# Patient Record
Sex: Male | Born: 1940 | Hispanic: No | Marital: Married | Smoking: Former smoker
Health system: Southern US, Community
[De-identification: ages and names within clinical notes are randomized; demographics above are authoritative.]

---

## 2010-04-08 ENCOUNTER — Ambulatory Visit (HOSPITAL_BASED_OUTPATIENT_CLINIC_OR_DEPARTMENT_OTHER): Admission: RE | Admit: 2010-04-08 | Discharge: 2010-04-08 | Payer: Self-pay | Admitting: Orthopedic Surgery

## 2010-04-08 ENCOUNTER — Ambulatory Visit: Payer: Self-pay | Admitting: Radiology

## 2015-10-12 DIAGNOSIS — I10 Essential (primary) hypertension: Secondary | ICD-10-CM | POA: Insufficient documentation

## 2015-10-13 DIAGNOSIS — J9601 Acute respiratory failure with hypoxia: Secondary | ICD-10-CM | POA: Insufficient documentation

## 2015-10-14 DIAGNOSIS — G250 Essential tremor: Secondary | ICD-10-CM | POA: Insufficient documentation

## 2016-01-19 DIAGNOSIS — G8929 Other chronic pain: Secondary | ICD-10-CM | POA: Insufficient documentation

## 2016-01-19 DIAGNOSIS — I2510 Atherosclerotic heart disease of native coronary artery without angina pectoris: Secondary | ICD-10-CM | POA: Insufficient documentation

## 2016-01-19 DIAGNOSIS — I214 Non-ST elevation (NSTEMI) myocardial infarction: Secondary | ICD-10-CM | POA: Insufficient documentation

## 2016-01-19 DIAGNOSIS — N183 Chronic kidney disease, stage 3 unspecified: Secondary | ICD-10-CM | POA: Insufficient documentation

## 2016-01-19 DIAGNOSIS — E782 Mixed hyperlipidemia: Secondary | ICD-10-CM | POA: Insufficient documentation

## 2016-01-19 DIAGNOSIS — E039 Hypothyroidism, unspecified: Secondary | ICD-10-CM | POA: Insufficient documentation

## 2016-01-19 DIAGNOSIS — R0989 Other specified symptoms and signs involving the circulatory and respiratory systems: Secondary | ICD-10-CM | POA: Insufficient documentation

## 2016-01-19 DIAGNOSIS — M545 Low back pain, unspecified: Secondary | ICD-10-CM | POA: Insufficient documentation

## 2016-01-23 DIAGNOSIS — R609 Edema, unspecified: Secondary | ICD-10-CM | POA: Insufficient documentation

## 2016-01-23 DIAGNOSIS — J431 Panlobular emphysema: Secondary | ICD-10-CM | POA: Insufficient documentation

## 2016-01-23 DIAGNOSIS — Z951 Presence of aortocoronary bypass graft: Secondary | ICD-10-CM | POA: Insufficient documentation

## 2016-02-21 DIAGNOSIS — R609 Edema, unspecified: Secondary | ICD-10-CM | POA: Insufficient documentation

## 2016-02-21 DIAGNOSIS — R600 Localized edema: Secondary | ICD-10-CM | POA: Insufficient documentation

## 2016-02-26 DIAGNOSIS — I4819 Other persistent atrial fibrillation: Secondary | ICD-10-CM | POA: Insufficient documentation

## 2016-04-03 DIAGNOSIS — IMO0002 Reserved for concepts with insufficient information to code with codable children: Secondary | ICD-10-CM | POA: Insufficient documentation

## 2016-04-03 DIAGNOSIS — I255 Ischemic cardiomyopathy: Secondary | ICD-10-CM | POA: Insufficient documentation

## 2016-04-03 DIAGNOSIS — Z87891 Personal history of nicotine dependence: Secondary | ICD-10-CM | POA: Insufficient documentation

## 2017-08-14 DIAGNOSIS — Z7982 Long term (current) use of aspirin: Secondary | ICD-10-CM | POA: Insufficient documentation

## 2017-09-17 DIAGNOSIS — I6523 Occlusion and stenosis of bilateral carotid arteries: Secondary | ICD-10-CM | POA: Insufficient documentation

## 2017-10-15 DIAGNOSIS — Z9981 Dependence on supplemental oxygen: Secondary | ICD-10-CM | POA: Insufficient documentation

## 2017-10-15 DIAGNOSIS — R0602 Shortness of breath: Secondary | ICD-10-CM | POA: Insufficient documentation

## 2017-10-15 DIAGNOSIS — F419 Anxiety disorder, unspecified: Secondary | ICD-10-CM | POA: Insufficient documentation

## 2018-01-08 DIAGNOSIS — I340 Nonrheumatic mitral (valve) insufficiency: Secondary | ICD-10-CM | POA: Insufficient documentation

## 2018-01-09 DIAGNOSIS — Z7901 Long term (current) use of anticoagulants: Secondary | ICD-10-CM | POA: Insufficient documentation

## 2018-01-17 DIAGNOSIS — I429 Cardiomyopathy, unspecified: Secondary | ICD-10-CM | POA: Insufficient documentation

## 2018-02-04 DIAGNOSIS — R339 Retention of urine, unspecified: Secondary | ICD-10-CM | POA: Insufficient documentation

## 2018-06-16 ENCOUNTER — Ambulatory Visit (HOSPITAL_BASED_OUTPATIENT_CLINIC_OR_DEPARTMENT_OTHER)
Admission: RE | Admit: 2018-06-16 | Discharge: 2018-06-16 | Disposition: A | Discharge status: Home / Self Care | Payer: Medicare Other | Source: Ambulatory Visit | Attending: Pulmonary Disease | Admitting: Pulmonary Disease

## 2018-06-16 ENCOUNTER — Ambulatory Visit: Payer: Medicare Other | Admitting: Pulmonary Disease

## 2018-06-16 DIAGNOSIS — J189 Pneumonia, unspecified organism: Secondary | ICD-10-CM

## 2018-06-16 DIAGNOSIS — J90 Pleural effusion, not elsewhere classified: Secondary | ICD-10-CM | POA: Insufficient documentation

## 2018-06-16 DIAGNOSIS — J181 Lobar pneumonia, unspecified organism: Secondary | ICD-10-CM

## 2018-06-16 DIAGNOSIS — R0602 Shortness of breath: Secondary | ICD-10-CM

## 2018-06-16 DIAGNOSIS — Z951 Presence of aortocoronary bypass graft: Secondary | ICD-10-CM | POA: Insufficient documentation

## 2018-06-16 DIAGNOSIS — J432 Centrilobular emphysema: Secondary | ICD-10-CM

## 2018-06-16 NOTE — Progress Notes (Signed)
Synopsis: Referred in July 2019 for COPD.  He has a history of atrial fibrillation, chronic respiratory failure with hypoxemia and has been on 2 L of oxygen since 2016.  Subjective:   PATIENT ID: James Santana GENDER: male DOB: 1941-08-05, MRN: [id number]   HPI  No chief complaint on file.  This is a pleasant 77 year old male with a past medical history significant for COPD who comes to my clinic today for evaluation and management of the same.  His past medical history includes coronary artery disease, he is status post CABG surgery, and atrial fibrillation.  His wife provides much of the history because the patient seems to be a bit confused.  She says that his breathing has been stable recently.  He does produce yellow mucus on a daily basis which has been a chronic symptom for him for at least the last 2 to 3 years.  He takes Symbicort in the mornings and in the evenings any take Spiriva daily.  He uses albuterol a few times a week as needed for chest tightness or shortness of breath.  They tell me that in February of this year he had fluid retention and ankle swelling and was hospitalized for management of the same.  While there he ended up having pneumonia which was treated.  By my review of the medical record it seems as if he had concern for an empyema and a CT scan was performed in March showing a loculated pleural effusion on the right lung.  He was also noted to have emphysema.  He has been using 2 L of oxygen continuously for the last 3 years.  He is participating in exercise activities at home per the direction of a physical therapist.  He says that he can walk on level ground without stopping but his wife says there is no way he can make it through a grocery store.  No past medical history on file.   No family history on file.   Social History   Socioeconomic History  . Marital status: Married    Spouse name: Not on file  . Number of children: Not on file  . Years  of education: Not on file  . Highest education level: Not on file  Occupational History  . Not on file  Social Needs  . Financial resource strain: Not on file  . Food insecurity:    Worry: Not on file    Inability: Not on file  . Transportation needs:    Medical: Not on file    Non-medical: Not on file  Tobacco Use  . Smoking status: Not on file  Substance and Sexual Activity  . Alcohol use: Not on file  . Drug use: Not on file  . Sexual activity: Not on file  Lifestyle  . Physical activity:    Days per week: Not on file    Minutes per session: Not on file  . Stress: Not on file  Relationships  . Social connections:    Talks on phone: Not on file    Gets together: Not on file    Attends religious service: Not on file    Active member of club or organization: Not on file    Attends meetings of clubs or organizations: Not on file    Relationship status: Not on file  . Intimate partner violence:    Fear of current or ex partner: Not on file    Emotionally abused: Not on file    Physically abused:  Not on file    Forced sexual activity: Not on file  Other Topics Concern  . Not on file  Social History Narrative  . Not on file     Not on File   Outpatient Medications Prior to Visit  Medication Sig Dispense Refill  . albuterol (PROVENTIL HFA;VENTOLIN HFA) 108 (90 Base) MCG/ACT inhaler Inhale into the lungs every 6 (six) hours as needed for wheezing or shortness of breath.    Marland Kitchen apixaban (ELIQUIS) 2.5 MG TABS tablet Take 2.5 mg by mouth 2 (two) times daily.    Marland Kitchen aspirin EC 81 MG tablet Take 81 mg by mouth daily.    Marland Kitchen atorvastatin (LIPITOR) 40 MG tablet Take 40 mg by mouth daily.    . budesonide-formoterol (SYMBICORT) 160-4.5 MCG/ACT inhaler Inhale 2 puffs into the lungs 2 (two) times daily.    . busPIRone (BUSPAR) 10 MG tablet Take 10 mg by mouth 3 (three) times daily.    . citalopram (CELEXA) 20 MG tablet Take 20 mg by mouth daily.    . clobetasol cream (TEMOVATE) 0.05 %  Apply 1 application topically 2 (two) times daily.    . fluticasone (FLONASE) 50 MCG/ACT nasal spray Place into both nostrils daily.    Marland Kitchen levothyroxine (SYNTHROID, LEVOTHROID) 50 MCG tablet Take 50 mcg by mouth daily before breakfast.    . lisinopril (PRINIVIL,ZESTRIL) 2.5 MG tablet Take 2.5 mg by mouth daily.    . metFORMIN (GLUCOPHAGE) 500 MG tablet Take by mouth 2 (two) times daily with a meal.    . metolazone (ZAROXOLYN) 2.5 MG tablet Take 2.5 mg by mouth daily.    . nitroGLYCERIN (NITROSTAT) 0.4 MG SL tablet Place 0.4 mg under the tongue every 5 (five) minutes as needed for chest pain.    Marland Kitchen omeprazole (PRILOSEC) 20 MG capsule Take 20 mg by mouth daily.    . tamsulosin (FLOMAX) 0.4 MG CAPS capsule Take 0.4 mg by mouth.    . tiotropium (SPIRIVA) 18 MCG inhalation capsule Place 18 mcg into inhaler and inhale daily.    Marland Kitchen torsemide (DEMADEX) 20 MG tablet Take 20 mg by mouth daily.     No facility-administered medications prior to visit.     Review of Systems  Constitutional: Positive for malaise/fatigue. Negative for chills, fever and weight loss.  HENT: Negative for congestion, nosebleeds, sinus pain and sore throat.   Eyes: Negative for photophobia, pain and discharge.  Respiratory: Positive for cough and shortness of breath. Negative for hemoptysis, sputum production and wheezing.   Cardiovascular: Negative for chest pain, palpitations and orthopnea.  Gastrointestinal: Negative for abdominal pain, constipation, diarrhea, nausea and vomiting.  Genitourinary: Negative for dysuria, frequency, hematuria and urgency.  Musculoskeletal: Negative for back pain, joint pain, myalgias and neck pain.  Skin: Negative for itching and rash.  Neurological: Negative for tingling, tremors, sensory change, speech change, focal weakness, seizures, weakness and headaches.  Psychiatric/Behavioral: Negative for memory loss, substance abuse and suicidal ideas. The patient is not nervous/anxious.        Objective:  Physical Exam   There were no vitals filed for this visit.  RA  Walked 500 feet on 2 L nasal cannula and O2 saturation remained at 98% or above  Gen: chronically ill appearing, no acute distress HENT: NCAT, OP clear, neck supple without masses Eyes: PERRL, EOMi Lymph: no cervical lymphadenopathy PULM: Coarse crackles bases bilaterally, normal air movement and effort CV: Irreg irreg, no mgr, no JVD GI: BS+, soft, nontender, no hsm Derm: no rash  or skin breakdown MSK: normal bulk and tone Neuro: A&Ox4, CN II-XII intact, strength 5/5 in all 4 extremities Psyche: normal mood and affect   CBC No results found for: WBC, RBC, HGB, HCT, PLT, MCV, MCH, MCHC, RDW, LYMPHSABS, MONOABS, EOSABS, BASOSABS   Chest imaging: March 2019 chest CT from an outside facility showed a loculated pleural effusion on the right, some airspace disease on the right, emphysema.  Small to borderline enlarged mediastinal lymph nodes noted.  PFT: July 2018 spirometry from outside facility showed an FEV1 of 1.33 L 58% predicted July 2019 ratio 51%, FEV1 1.47 L 52% predicted, forced vital capacity 2.9 L 77% predicted  Labs:  Path:  Echo:  Heart Catheterization:  Records from his primary care physician reviewed where it seems that he had a known problem of a pleural effusion on the right with concern for a lung mass.  CT scanning of the chest showed residual pneumonia.     Assessment & Plan:   Shortness of breath - Plan: DG Chest 2 View, Spirometry with graph, Spirometry with graph  Centrilobular emphysema (HCC)  Community acquired pneumonia of right middle lobe of lung (HCC)  Pleural effusion on right  Discussion: This is a pleasant 77 year old male with a past medical history significant for at least moderate COPD who comes to our clinic today for evaluation and management of the same.  He also has chronic respiratory failure with hypoxemia which is presumably due to his  COPD.  However, he did have a recent case of pneumonia which apparently was quite severe and left him with a pleural effusion on the right side.  We need to get a chest x-ray today to see if this is cleared up.  At this point it is too late to consider any sort of surgical intervention if there is a residual pleural effusion.  His current medical regimen for his COPD is adequate.  I see no reason to make changes right now.  Of note he takes amiodarone and lisinopril and seems to have no ill effect from a respiratory standpoint from either.  Plan: COPD: Continue Symbicort Continue Spiriva Keep using albuterol as needed for chest tightness wheezing or shortness of breath Stay active, try to exercise regularly If you are interested in pulmonary rehab please let us know Practice good hand hygiene Get a flu shot in the fall Spirometry test today  Recent pneumonia with pleural effusion: We will check a chest x-ray today and call you with the result  Chronic respiratory failure with hypoxemia: Continue 2 L of oxygen continuously  We will see you back in 3 months or sooner if needed    Current Outpatient Medications:  .  albuterol (PROVENTIL HFA;VENTOLIN HFA) 108 (90 Base) MCG/ACT inhaler, Inhale into the lungs every 6 (six) hours as needed for wheezing or shortness of breath., Disp: , Rfl:  .  apixaban (ELIQUIS) 2.5 MG TABS tablet, Take 2.5 mg by mouth 2 (two) times daily., Disp: , Rfl:  .  aspirin EC 81 MG tablet, Take 81 mg by mouth daily., Disp: , Rfl:  .  atorvastatin (LIPITOR) 40 MG tablet, Take 40 mg by mouth daily., Disp: , Rfl:  .  budesonide-formoterol (SYMBICORT) 160-4.5 MCG/ACT inhaler, Inhale 2 puffs into the lungs 2 (two) times daily., Disp: , Rfl:  .  busPIRone (BUSPAR) 10 MG tablet, Take 10 mg by mouth 3 (three) times daily., Disp: , Rfl:  .  citalopram (CELEXA) 20 MG tablet, Take 20 mg by mouth daily.,  Disp: , Rfl:  .  clobetasol cream (TEMOVATE) 0.05 %, Apply 1  application topically 2 (two) times daily., Disp: , Rfl:  .  fluticasone (FLONASE) 50 MCG/ACT nasal spray, Place into both nostrils daily., Disp: , Rfl:  .  levothyroxine (SYNTHROID, LEVOTHROID) 50 MCG tablet, Take 50 mcg by mouth daily before breakfast., Disp: , Rfl:  .  lisinopril (PRINIVIL,ZESTRIL) 2.5 MG tablet, Take 2.5 mg by mouth daily., Disp: , Rfl:  .  metFORMIN (GLUCOPHAGE) 500 MG tablet, Take by mouth 2 (two) times daily with a meal., Disp: , Rfl:  .  metolazone (ZAROXOLYN) 2.5 MG tablet, Take 2.5 mg by mouth daily., Disp: , Rfl:  .  nitroGLYCERIN (NITROSTAT) 0.4 MG SL tablet, Place 0.4 mg under the tongue every 5 (five) minutes as needed for chest pain., Disp: , Rfl:  .  omeprazole (PRILOSEC) 20 MG capsule, Take 20 mg by mouth daily., Disp: , Rfl:  .  tamsulosin (FLOMAX) 0.4 MG CAPS capsule, Take 0.4 mg by mouth., Disp: , Rfl:  .  tiotropium (SPIRIVA) 18 MCG inhalation capsule, Place 18 mcg into inhaler and inhale daily., Disp: , Rfl:  .  torsemide (DEMADEX) 20 MG tablet, Take 20 mg by mouth daily., Disp: , Rfl:

## 2018-06-16 NOTE — Patient Instructions (Signed)
COPD: Continue Symbicort Continue Spiriva Keep using albuterol as needed for chest tightness wheezing or shortness of breath Stay active, try to exercise regularly If you are interested in pulmonary rehab please let us know Practice good hand hygiene Get a flu shot in the fall Spirometry test today  Recent pneumonia with pleural effusion: We will check a chest x-ray today and call you with the result  Chronic respiratory failure with hypoxemia: Continue 2 L of oxygen continuously  We will see you back in 3 months or sooner if needed

## 2018-06-17 ENCOUNTER — Telehealth: Payer: Self-pay | Admitting: Pulmonary Disease

## 2018-06-17 NOTE — Telephone Encounter (Signed)
Notes recorded by Lupita LeashMcQuaid, Douglas B, MD on 06/17/2018 at 1:55 PM EDT BJ, Please let the patient know this showed that there was still some fluid in his R lung and radiology recommends we repeat a CXR. Can we order this for the next visit? Reason pleural effusion. Thanks, B ------------------------------ Spoke with pt. He is aware of results. Nothing further was needed.

## 2018-06-18 ENCOUNTER — Other Ambulatory Visit: Payer: Self-pay | Admitting: Pulmonary Disease

## 2018-06-18 DIAGNOSIS — J90 Pleural effusion, not elsewhere classified: Secondary | ICD-10-CM

## 2018-09-15 NOTE — Progress Notes (Deleted)
@Patient  ID: James Santana, male    DOB: Feb 19, 1941, 77 y.o.   MRN: [id number]  No chief complaint on file.   Referring provider: Shellia Cleverly, PA  HPI:  77 year old male followed in our office for COPD  PMH: CAD, status post CABG surgery, A. Fib (on eliquis) Smoker/ Smoking History:  Maintenance:  Symbicort 160, Spiriva Pt of: Dr. Kendrick Fries  Recent [facility] Encounters:   06/16/18-initial office visit-Mcquaid Patient presenting today to establish care for management of COPD.  Patient takes Symbicort in the mornings and evenings and takes Spiriva daily.  Patient produces yellow mucus on a daily basis this is been a chronic symptom for him for the last 2 to 3 years.  Wife presenting with patient today is providing much of the patient history as patient is intermittently confused.  Patient's wife reports that the patient was treated in February/2019 for pneumonia.  CT scan was performed in March/2019 due to concern for empyema.  Showing loculated pleural effusion.  Patient's been using 2 L of oxygen continuously for the last 3 years.  He participates in exercise activities at home per the direction of physical therapy. Plan: Chest x-ray, Continue Symbicort, continue Spiriva, stay active, get flu shot in fall, spirometry today, continue 2 L of oxygen, follow-up in 3 months   09/15/2018  - Visit   HPI    Tests:   06/17/18-chest x-ray- right perihilar right base infiltrate right-sided pleural effusion again noted, recommend follow-up chest x-ray in 3 to 4 weeks 06/16/2018-spirometry- FVC 2.9 (77% predicted), ratio 51%, FEV1 2.50 (52% predicted)  Chest imaging: March 2019 chest CT from an outside facility showed a loculated pleural effusion on the right, some airspace disease on the right, emphysema.  Small to borderline enlarged mediastinal lymph nodes noted.  PFT: July 2018 spirometry from outside facility showed an FEV1 of 1.33 L 58% predicted July 2019 ratio  51%, FEV1 1.47 L 52% predicted, forced vital capacity 2.9 L 77% predicted  Records from his primary care physician reviewed where it seems that he had a known problem of a pleural effusion on the right with concern for a lung mass.  CT scanning of the chest showed residual pneumonia.   FENO:  No results found for: NITRICOXIDE  PFT: No flowsheet data found.  Imaging: No results found.  Chart Review:    Specialty Problems    None      Not on File   There is no immunization history on file for this patient.  No past medical history on file.  Tobacco History: Social History   Tobacco Use  Smoking Status Not on file   Counseling given: Not Answered   Outpatient Encounter Medications as of 09/16/2018  Medication Sig  . albuterol (PROVENTIL HFA;VENTOLIN HFA) 108 (90 Base) MCG/ACT inhaler Inhale into the lungs every 6 (six) hours as needed for wheezing or shortness of breath.  Marland Kitchen apixaban (ELIQUIS) 2.5 MG TABS tablet Take 2.5 mg by mouth 2 (two) times daily.  Marland Kitchen aspirin EC 81 MG tablet Take 81 mg by mouth daily.  Marland Kitchen atorvastatin (LIPITOR) 40 MG tablet Take 40 mg by mouth daily.  . budesonide-formoterol (SYMBICORT) 160-4.5 MCG/ACT inhaler Inhale 2 puffs into the lungs 2 (two) times daily.  . busPIRone (BUSPAR) 10 MG tablet Take 10 mg by mouth 3 (three) times daily.  . citalopram (CELEXA) 20 MG tablet Take 20 mg by mouth daily.  . clobetasol cream (TEMOVATE) 0.05 % Apply 1 application topically 2 (two) times  daily.  . fluticasone (FLONASE) 50 MCG/ACT nasal spray Place into both nostrils daily.  Marland Kitchen levothyroxine (SYNTHROID, LEVOTHROID) 50 MCG tablet Take 50 mcg by mouth daily before breakfast.  . lisinopril (PRINIVIL,ZESTRIL) 2.5 MG tablet Take 2.5 mg by mouth daily.  . metFORMIN (GLUCOPHAGE) 500 MG tablet Take by mouth 2 (two) times daily with a meal.  . metolazone (ZAROXOLYN) 2.5 MG tablet Take 2.5 mg by mouth daily.  . nitroGLYCERIN (NITROSTAT) 0.4 MG SL tablet Place 0.4 mg  under the tongue every 5 (five) minutes as needed for chest pain.  Marland Kitchen omeprazole (PRILOSEC) 20 MG capsule Take 20 mg by mouth daily.  . tamsulosin (FLOMAX) 0.4 MG CAPS capsule Take 0.4 mg by mouth.  . tiotropium (SPIRIVA) 18 MCG inhalation capsule Place 18 mcg into inhaler and inhale daily.  Marland Kitchen torsemide (DEMADEX) 20 MG tablet Take 20 mg by mouth daily.   No facility-administered encounter medications on file as of 09/16/2018.      Review of Systems  Review of Systems   Physical Exam  There were no vitals taken for this visit.  Wt Readings from Last 5 Encounters:  No data found for Wt     Physical Exam    Lab Results:  CBC No results found for: WBC, RBC, HGB, HCT, PLT, MCV, MCH, MCHC, RDW, LYMPHSABS, MONOABS, EOSABS, BASOSABS  BMET No results found for: NA, K, CL, CO2, GLUCOSE, BUN, CREATININE, CALCIUM, GFRNONAA, GFRAA  BNP No results found for: BNP  ProBNP No results found for: PROBNP    Assessment & Plan:     No problem-specific Assessment & Plan notes found for this encounter.     Coral Ceo, NP 09/15/2018

## 2018-09-16 ENCOUNTER — Ambulatory Visit: Payer: Medicare Other | Admitting: Pulmonary Disease

## 2018-09-18 ENCOUNTER — Other Ambulatory Visit: Payer: Self-pay

## 2018-09-18 ENCOUNTER — Ambulatory Visit (HOSPITAL_BASED_OUTPATIENT_CLINIC_OR_DEPARTMENT_OTHER)
Admission: RE | Admit: 2018-09-18 | Discharge: 2018-09-18 | Disposition: A | Discharge status: Home / Self Care | Payer: Medicare Other | Source: Ambulatory Visit | Attending: Adult Health | Admitting: Adult Health

## 2018-09-18 ENCOUNTER — Ambulatory Visit (INDEPENDENT_AMBULATORY_CARE_PROVIDER_SITE_OTHER): Payer: Medicare Other | Admitting: Adult Health

## 2018-09-18 ENCOUNTER — Encounter: Payer: Self-pay | Admitting: Adult Health

## 2018-09-18 VITALS — BP 143/65 | HR 52 | Wt 168.0 lb

## 2018-09-18 DIAGNOSIS — R918 Other nonspecific abnormal finding of lung field: Secondary | ICD-10-CM | POA: Insufficient documentation

## 2018-09-18 DIAGNOSIS — E119 Type 2 diabetes mellitus without complications: Secondary | ICD-10-CM | POA: Insufficient documentation

## 2018-09-18 DIAGNOSIS — J90 Pleural effusion, not elsewhere classified: Secondary | ICD-10-CM | POA: Insufficient documentation

## 2018-09-18 DIAGNOSIS — J9611 Chronic respiratory failure with hypoxia: Secondary | ICD-10-CM | POA: Diagnosis not present

## 2018-09-18 DIAGNOSIS — J4489 Other specified chronic obstructive pulmonary disease: Secondary | ICD-10-CM

## 2018-09-18 DIAGNOSIS — J449 Chronic obstructive pulmonary disease, unspecified: Secondary | ICD-10-CM | POA: Diagnosis not present

## 2018-09-18 NOTE — Progress Notes (Signed)
@Patient  ID: James Santana, male    DOB: 09-Nov-1941, 77 y.o.   MRN: [id number]  Chief Complaint  Patient presents with  . Follow-up    COPD     Referring provider: Shellia Cleverly, [place]  HPI: 77 year old male former smoker seen for pulmonary consult to establish for COPD and chronic respiratory failure on oxygen per disease mainly with activity and at bedtime) June 16, 2018 Medical history significant for coronary artery disease status post CABG, A. fib on Eliquis and amiodarone, hypertension on ACE inhibitor, chronic kidney disease  Test/events  February 2019 pneumonia with pleural effusion hospitalized at outlying hospital CT chest March 2019 from outside facility showed loculated small pleural effusion on the right, airspace disease on the right, emphysema, small to borderline enlarged mediastinal nodes  Office walk test July 2019 O2 saturations on 2 L with O2 saturations 98%  PFT July 2018 (outlying hospital) FEV1 58% PFT July 2019 FEV1 52%, ratio 51   09/18/2018 Follow up : COPD , O2 RF , Pleural Effusion  Patient returns for a 28-month follow-up.  Patient was seen earlier this year for a pulmonary consult to establish for COPD and oxygen dependent respiratory failure.  Also patient had been hospitalized in February of this year for an apparent pneumonia with pleural effusion.  A follow-up CT chest March 2019 showed a loculated small pleural effusion with some residual right airspace disease.  Chest x-ray in July showed improved aeration with a small right-sided infiltrate and effusion.  Chest x-ray today shows no significant change in right sided opacity and a chronic small effusion. Patient remains on Symbicort and Spiriva.  Says since last visit he is doing better.  Strength is starting to improve.  He is trying to be more active.  He does get short of breath with minimum activity.  Appetite is fair.  He denies any hemoptysis, chest pain, orthopnea, increased leg swelling.    Flu shot is utd.  Believes Pneumovax and Prevnar are utd.   Patient is on ACE inhibitor and amiodarone.  Denies any increased cough.  Allergies  Allergen Reactions  . Codeine Palpitations    Immunization History  Administered Date(s) Administered  . Influenza, High Dose Seasonal PF 09/06/2013, 12/08/2014, 09/03/2018  . Influenza,inj,Quad PF,6+ Mos 09/05/2004, 09/05/2006, 08/30/2008, 10/02/2012, 10/15/2015, 08/27/2016, 09/13/2017    History reviewed. No pertinent past medical history.  Tobacco History: Social History   Tobacco Use  Smoking Status Former Smoker  . Types: Cigarettes  . Last attempt to quit: 05/03/2016  . Years since quitting: 2.3  Smokeless Tobacco Never Used   Counseling given: Not Answered   Outpatient Medications Prior to Visit  Medication Sig Dispense Refill  . albuterol (PROVENTIL HFA;VENTOLIN HFA) 108 (90 Base) MCG/ACT inhaler Inhale into the lungs every 6 (six) hours as needed for wheezing or shortness of breath.    Marland Kitchen amiodarone (PACERONE) 200 MG tablet Take 200 mg by mouth daily. Patient is taking a 1/2 tablet daily    . apixaban (ELIQUIS) 2.5 MG TABS tablet Take 2.5 mg by mouth 2 (two) times daily.    Marland Kitchen aspirin EC 81 MG tablet Take 81 mg by mouth daily.    Marland Kitchen atorvastatin (LIPITOR) 40 MG tablet Take 40 mg by mouth daily.    . budesonide-formoterol (SYMBICORT) 160-4.5 MCG/ACT inhaler Inhale 2 puffs into the lungs 2 (two) times daily.    . busPIRone (BUSPAR) 10 MG tablet Take 10 mg by mouth 3 (three) times daily.    . citalopram (  CELEXA) 20 MG tablet Take 20 mg by mouth daily.    . clobetasol cream (TEMOVATE) 0.05 % Apply 1 application topically 2 (two) times daily.    . fluticasone (FLONASE) 50 MCG/ACT nasal spray Place into both nostrils daily.    Marland Kitchen levothyroxine (SYNTHROID, LEVOTHROID) 50 MCG tablet Take 50 mcg by mouth daily before breakfast.    . lisinopril (PRINIVIL,ZESTRIL) 2.5 MG tablet Take 2.5 mg by mouth daily.    . metolazone (ZAROXOLYN) 2.5  MG tablet Take 2.5 mg by mouth daily.    . nitroGLYCERIN (NITROSTAT) 0.4 MG SL tablet Place 0.4 mg under the tongue every 5 (five) minutes as needed for chest pain.    Marland Kitchen omeprazole (PRILOSEC) 20 MG capsule Take 20 mg by mouth daily.    . tamsulosin (FLOMAX) 0.4 MG CAPS capsule Take 0.4 mg by mouth.    . tiotropium (SPIRIVA) 18 MCG inhalation capsule Place 18 mcg into inhaler and inhale daily.    Marland Kitchen torsemide (DEMADEX) 20 MG tablet Take 20 mg by mouth daily.    . metFORMIN (GLUCOPHAGE) 500 MG tablet Take by mouth 2 (two) times daily with a meal.     No facility-administered medications prior to visit.      Review of Systems  Constitutional:   No  weight loss, night sweats,  Fevers, chills, + fatigue, or  lassitude.  HEENT:   No headaches,  Difficulty swallowing,  Tooth/dental problems, or  Sore throat,                No sneezing, itching, ear ache, nasal congestion, post nasal drip,   CV:  No chest pain,  Orthopnea, PND, swelling in lower extremities, anasarca, dizziness, palpitations, syncope.   GI  No heartburn, indigestion, abdominal pain, nausea, vomiting, diarrhea, change in bowel habits, loss of appetite, bloody stools.   Resp: No chest wall deformity  Skin: no rash or lesions.  GU: no dysuria, change in color of urine, no urgency or frequency.  No flank pain, no hematuria   MS:  No joint pain or swelling.  No decreased range of motion.  No back pain.    Physical Exam  BP (!) 143/65 (BP Location: Left Arm, Patient Position: Sitting, Cuff Size: Normal)   Pulse (!) 52   Wt 168 lb (76.2 kg)   SpO2 95%   GEN: A/Ox3; pleasant , NAD, frail , elderly , in wc    HEENT:  [place]/AT,  EACs-clear, TMs-wnl, NOSE-clear, THROAT-clear, no lesions, no postnasal drip or exudate noted.   NECK:  Supple w/ fair ROM; no JVD; normal carotid impulses w/o bruits; no thyromegaly or nodules palpated; no lymphadenopathy.    RESP  Decreased BS in bases  w/o, wheezes/ rales/ or rhonchi. no accessory  muscle use, no dullness to percussion  CARD:  RRR, no m/r/g, no peripheral edema, pulses intact, no cyanosis or clubbing.  GI:   Soft & nt; nml bowel sounds; no organomegaly or masses detected.   Musco: Warm bil, no deformities or joint swelling noted.   Neuro: alert, no focal deficits noted.    Skin: Warm, no lesions or rashes    Lab Results:  CBC No results found for: WBC, RBC, HGB, HCT, PLT, MCV, MCH, MCHC, RDW, LYMPHSABS, MONOABS, EOSABS, BASOSABS  BMET No results found for: NA, K, CL, CO2, GLUCOSE, BUN, CREATININE, CALCIUM, GFRNONAA, GFRAA  BNP No results found for: BNP  ProBNP No results found for: PROBNP  Imaging: Dg Chest 2 View  Result Date: 09/18/2018  CLINICAL DATA:  Pleural effusion on right. EXAM: CHEST - 2 VIEW COMPARISON:  06/16/2018, 02/19/2018.  Chest CT 02/24/2018 FINDINGS: Pleural and parenchymal opacity in the right lung base is stable most consistent with scarring and chronic small effusion. No pneumothorax. Left lung clear. Postop CABG. Negative for heart failure. IMPRESSION: Stable pleuroparenchymal process in the right lung base most likely due to scarring. Electronically Signed   By: Marlan Palau M.D.   On: 09/18/2018 10:00     Assessment & Plan:   COPD with chronic bronchitis and emphysema (HCC) Appears to be stable .  Flu shot is utd  Check w/ PCP for PVX -vaccine date  Deconditioning is main issue , discussed w/ pt and wife. Declined. Says doing exercises at home .  Cont on triple combo meds for COPD   Plan  Patient Instructions  Chest Xray today .  Continue on Symbicort and Spiriva , rinse after use.  Continue on Oxygen 2l/m with activity and At bedtime   Follow up with Dr. Henrene Pastor in 6 months and as needed.       Chronic respiratory failure with hypoxia (HCC) Cont on O2 with act and At bedtime  .   Pleural effusion Small Right pleural effusion with residual opacity on right - most likely from severe PNA in 01/2018 . Suspect this  is post infectious scarring and loculated effusion . Seems clinically improved.  He is former smoker . Will Check CT chest to make sure stable from previous CT chest 01/2018 .       Rubye Oaks, NP 09/18/2018

## 2018-09-18 NOTE — Patient Instructions (Addendum)
Chest Xray today .  Continue on Symbicort and Spiriva , rinse after use.  Continue on Oxygen 2l/m with activity and At bedtime   Follow up with Dr. Henrene Pastor in 6 months and as needed.

## 2018-09-18 NOTE — Assessment & Plan Note (Signed)
Appears to be stable .  Flu shot is utd  Check w/ PCP for PVX -vaccine date  Deconditioning is main issue , discussed w/ pt and wife. Declined. Says doing exercises at home .  Cont on triple combo meds for COPD   Plan  Patient Instructions  Chest Xray today .  Continue on Symbicort and Spiriva , rinse after use.  Continue on Oxygen 2l/m with activity and At bedtime   Follow up with Dr. Henrene Pastor in 6 months and as needed.

## 2018-09-18 NOTE — Assessment & Plan Note (Signed)
Cont on O2 with act and At bedtime   

## 2018-09-18 NOTE — Progress Notes (Signed)
Reviewed, agree 

## 2018-09-18 NOTE — Assessment & Plan Note (Signed)
Small Right pleural effusion with residual opacity on right - most likely from severe PNA in 01/2018 . Suspect this is post infectious scarring and loculated effusion . Seems clinically improved.  He is former smoker . Will Check CT chest to make sure stable from previous CT chest 01/2018 .

## 2018-09-27 ENCOUNTER — Ambulatory Visit (HOSPITAL_BASED_OUTPATIENT_CLINIC_OR_DEPARTMENT_OTHER)
Admission: RE | Admit: 2018-09-27 | Discharge: 2018-09-27 | Disposition: A | Discharge status: Home / Self Care | Payer: Medicare Other | Source: Ambulatory Visit | Attending: Adult Health | Admitting: Adult Health

## 2018-09-27 DIAGNOSIS — J90 Pleural effusion, not elsewhere classified: Secondary | ICD-10-CM | POA: Diagnosis present

## 2018-09-27 DIAGNOSIS — J439 Emphysema, unspecified: Secondary | ICD-10-CM | POA: Insufficient documentation

## 2018-09-27 DIAGNOSIS — I700 Atherosclerosis of aorta: Secondary | ICD-10-CM | POA: Insufficient documentation

## 2018-10-03 ENCOUNTER — Telehealth: Payer: Self-pay | Admitting: Adult Health

## 2018-10-03 DIAGNOSIS — J90 Pleural effusion, not elsewhere classified: Secondary | ICD-10-CM

## 2018-10-03 NOTE — Telephone Encounter (Signed)
Called and spoke with Patient. CT Chest results and recommendations given.  Patient stated understanding.  CT Chest without contrast ordered for 1 year, 09/2019.  Nothing further at this time.

## 2019-05-22 IMAGING — CT CT CHEST W/O CM
2 of 3 series · 15 of 36 positions shown, 18 images · non-contrast
Comparison: 02/24/2018.

CLINICAL DATA: Pleural effusion.

EXAM:
CT CHEST WITHOUT CONTRAST
TECHNIQUE: Multidetector CT imaging of the chest was performed following the
standard protocol without IV contrast.

[Series 2: thorax · axial · 0.75mm/px · z∈[-328,-22]mm · 12 of 181 slices shown, 15 images]
[im 14/181  mediastinal]
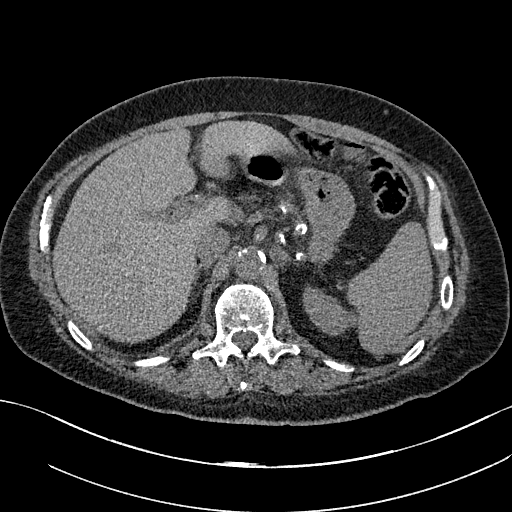
[im 14/181  lung]
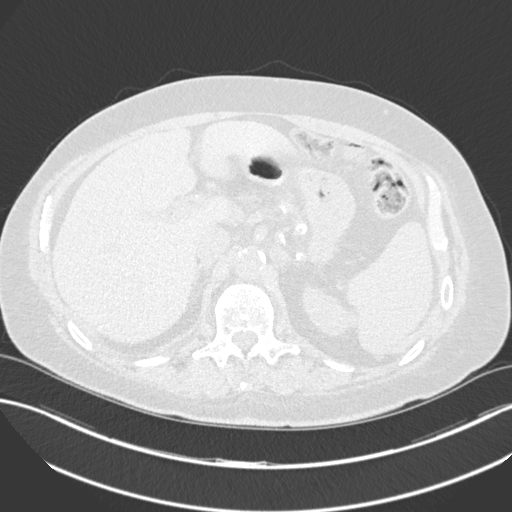
[im 27/181  lung]
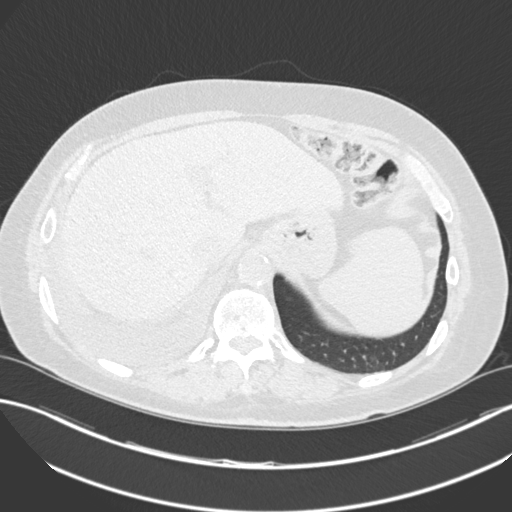
[im 41/181  lung]
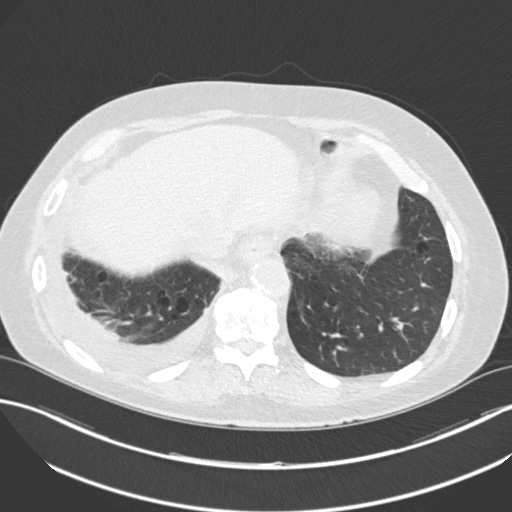
[im 54/181  lung]
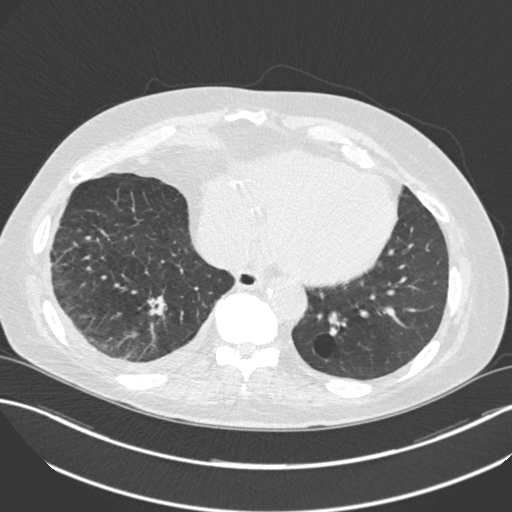
[im 67/181  mediastinal]
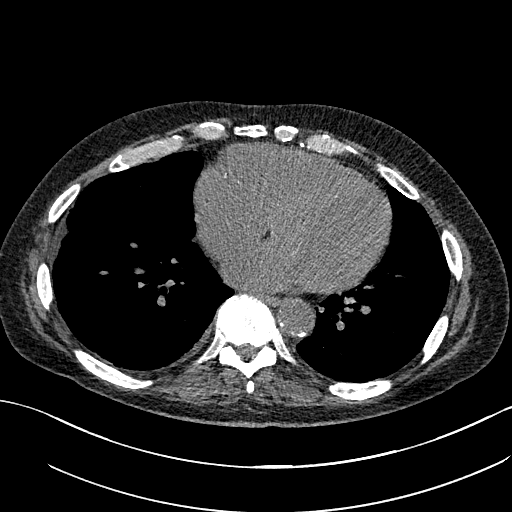
[im 67/181  lung]
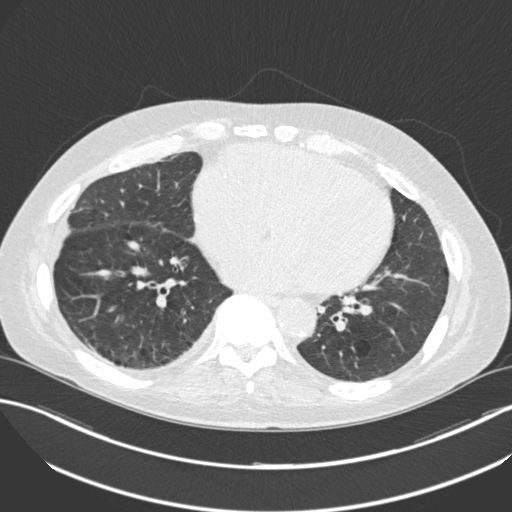
[im 81/181  lung]
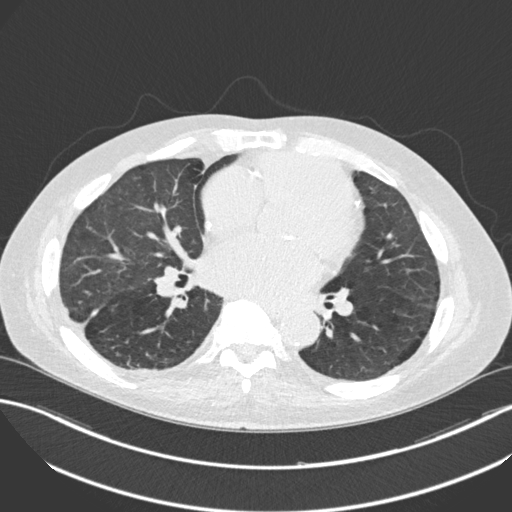
[im 101/181  lung]
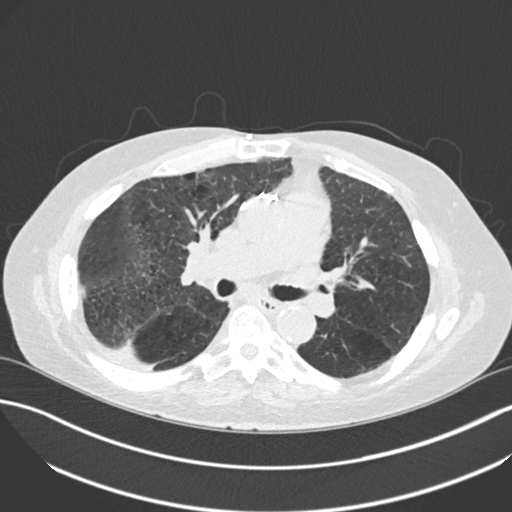
[im 114/181  lung]
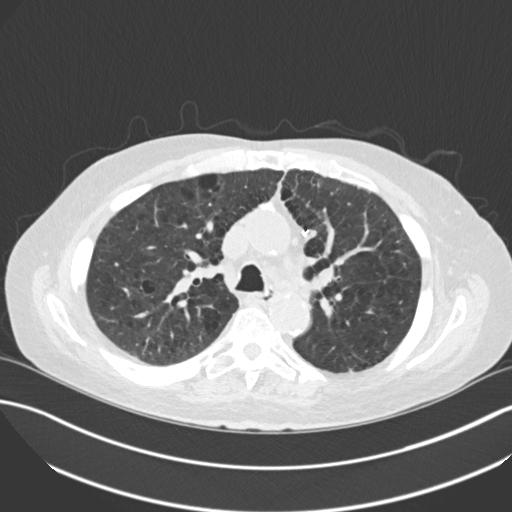
[im 127/181  mediastinal]
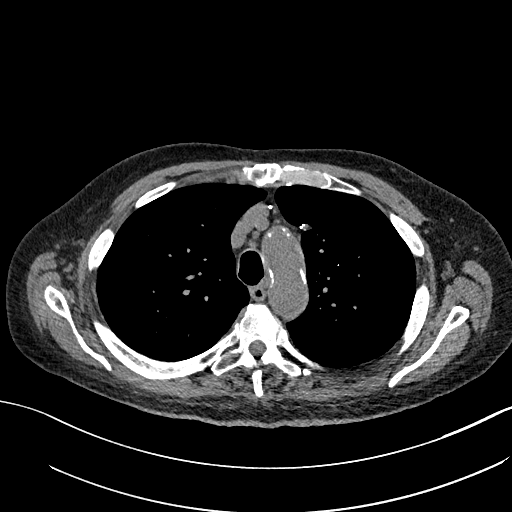
[im 127/181  lung]
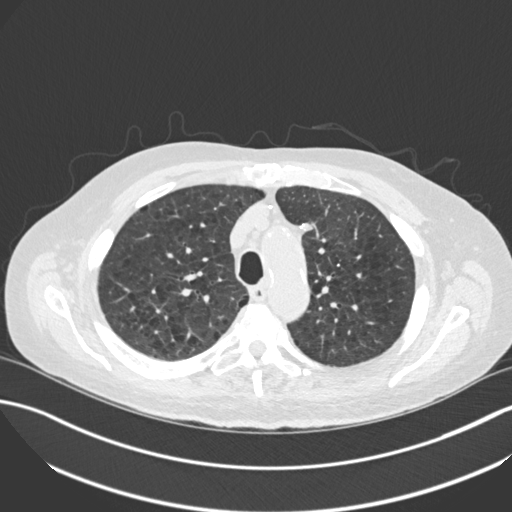
[im 141/181  lung]
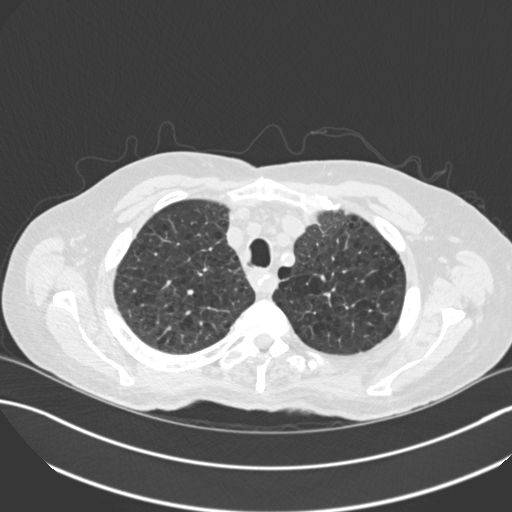
[im 154/181  lung]
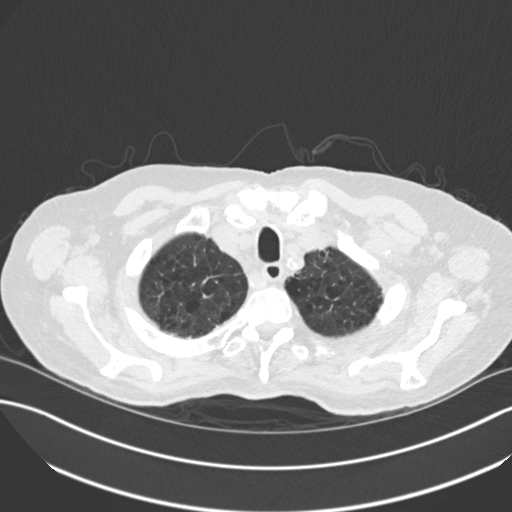
[im 167/181  lung]
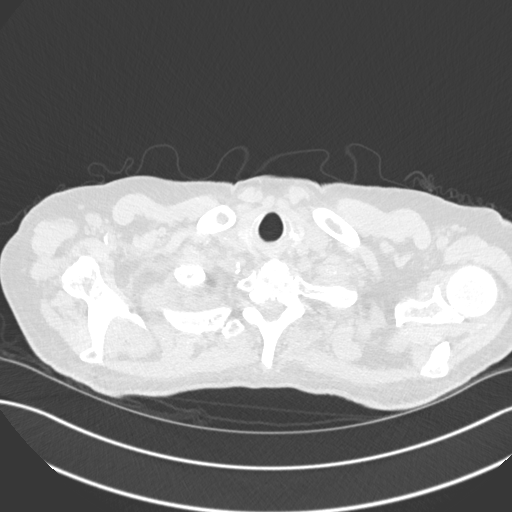

[Series 5: coronal · coronal · 0.71mm/px · 3 of 119 slices shown]
[im 24/119  lung]
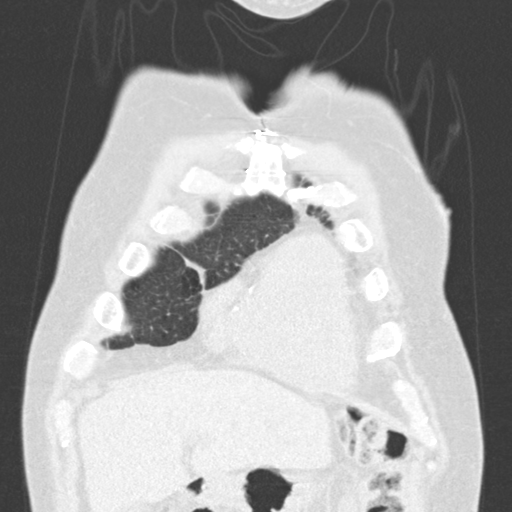
[im 48/119  lung]
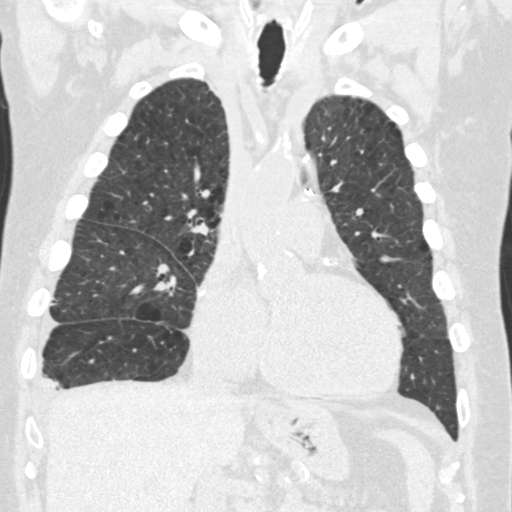
[im 71/119  lung]
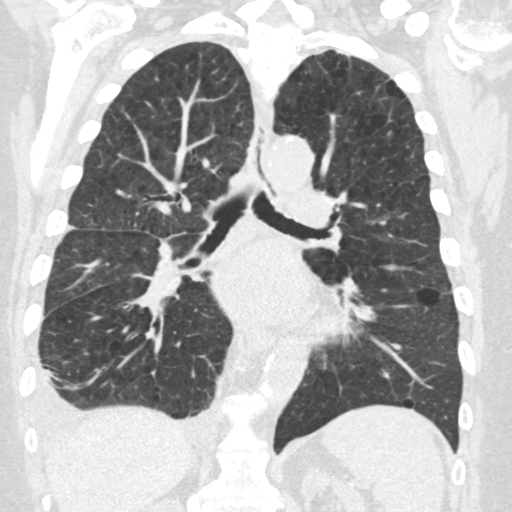

[15 of 36 positions shown; findings below may reference images not displayed]

FINDINGS: Cardiovascular: Atherosclerotic calcification of the arterial
vasculature. Aberrant right subclavian artery is incidentally noted.
Heart is enlarged. No pericardial effusion.

Mediastinum/Nodes: AP window lymph node measures 1.3 cm, stable.
Mediastinal lymph nodes are otherwise subcentimeter in short axis
size and decreased from prior. Hilar regions are difficult to
evaluate without IV contrast. No axillary adenopathy. Esophagus is
grossly unremarkable.

Lungs/Pleura: Centrilobular and paraseptal emphysema. 4 mm right
middle lobe nodule (series 3, image 92), unchanged. Perifissural
nodules measure 4 mm or less in size and are likely subpleural lymph
nodes. 3 mm left lower lobe nodule (image 130), stable. Small right
pleural effusion with minimal loculation. Mild compressive
atelectasis in the right lower lobe. Airway is unremarkable.

Upper Abdomen: Visualized portions of the liver, gallbladder,
adrenal glands and right kidney are unremarkable. 1.3 cm
low-attenuation lesion off the upper pole left kidney is difficult
to further characterize due to size and lack of postcontrast
imaging. Visualized portion of the left kidney may be somewhat
atrophic. Visualized portions of the spleen, pancreas, stomach and
bowel are unremarkable with exception of a small hiatal hernia. No
upper abdominal adenopathy.

Musculoskeletal: Degenerative changes in the spine. No worrisome
lytic or sclerotic lesions.
IMPRESSION: 1. Small right pleural effusion with mild compressive atelectasis in
the right lower lobe.
2. Previously seen mediastinal adenopathy is largely resolved.
3. Scattered tiny pulmonary nodules have been stable for 7 months.
Consider follow-up in 1 year patient is at increased risk for
bronchogenic carcinoma. This recommendation follows the consensus
statement: Guidelines for Management of Small Pulmonary Nodules
Detected on CT Images: From the [HOSPITAL] 0057; Radiology
0057; [DATE].
4.  Aortic atherosclerosis (HKH4I-170.0).
5.  Emphysema (HKH4I-1KL.M).

## 2019-10-05 ENCOUNTER — Ambulatory Visit (HOSPITAL_BASED_OUTPATIENT_CLINIC_OR_DEPARTMENT_OTHER): Payer: Medicare Other

## 2020-01-04 DEATH — deceased
